# Patient Record
Sex: Female | Born: 1985 | Race: White | Hispanic: No | Marital: Married
Health system: Southern US, Community
[De-identification: ages and names within clinical notes are randomized; demographics above are authoritative.]

## PROBLEM LIST (undated history)

## (undated) HISTORY — PX: TUBAL LIGATION: SHX77

---
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
## 2007-02-07 ENCOUNTER — Emergency Department (HOSPITAL_COMMUNITY): Admission: EM | Admit: 2007-02-07 | Discharge: 2007-02-07 | Payer: Self-pay | Admitting: Emergency Medicine

## 2011-05-06 LAB — URINALYSIS, ROUTINE W REFLEX MICROSCOPIC
Hgb urine dipstick: NEGATIVE
Nitrite: NEGATIVE
Specific Gravity, Urine: 1.011
Urobilinogen, UA: 0.2
pH: 6.5

## 2011-05-06 LAB — POCT PREGNANCY, URINE
Operator id: 161631
Preg Test, Ur: NEGATIVE

## 2012-02-05 ENCOUNTER — Encounter (HOSPITAL_COMMUNITY): Payer: Self-pay | Admitting: *Deleted

## 2012-02-05 ENCOUNTER — Emergency Department (HOSPITAL_COMMUNITY)
Admission: EM | Admit: 2012-02-05 | Discharge: 2012-02-06 | Disposition: A | Discharge status: Home / Self Care | Payer: Medicaid Other | Attending: Emergency Medicine | Admitting: Emergency Medicine

## 2012-02-05 DIAGNOSIS — N200 Calculus of kidney: Secondary | ICD-10-CM

## 2012-02-05 DIAGNOSIS — N390 Urinary tract infection, site not specified: Secondary | ICD-10-CM

## 2012-02-05 LAB — CBC
MCHC: 32.8 g/dL (ref 30.0–36.0)
RDW: 15.7 % — ABNORMAL HIGH (ref 11.5–15.5)

## 2012-02-05 LAB — COMPREHENSIVE METABOLIC PANEL
ALT: 16 U/L (ref 0–35)
AST: 19 U/L (ref 0–37)
Albumin: 4.5 g/dL (ref 3.5–5.2)
Alkaline Phosphatase: 73 U/L (ref 39–117)
Potassium: 3.9 mEq/L (ref 3.5–5.1)
Sodium: 140 mEq/L (ref 135–145)
Total Protein: 7.9 g/dL (ref 6.0–8.3)

## 2012-02-05 MED ORDER — ONDANSETRON 4 MG PO TBDP
8.0000 mg | ORAL_TABLET | Freq: Once | ORAL | Status: AC
  Administered 2012-02-05: 8 mg via ORAL
  Filled 2012-02-05: qty 2

## 2012-02-05 NOTE — ED Notes (Signed)
Pt actively vomiting in waiting room. 

## 2012-02-05 NOTE — ED Notes (Signed)
Pt reports LLQ pain and lower back pain x 1 week. Was seen at [facility] for same. Having n/v/d, denies urinary or vaginal symptoms.

## 2012-02-05 NOTE — ED Notes (Signed)
Reports having coffee ground emesis today

## 2012-02-06 ENCOUNTER — Encounter (HOSPITAL_COMMUNITY): Payer: Self-pay | Admitting: Radiology

## 2012-02-06 ENCOUNTER — Emergency Department (HOSPITAL_COMMUNITY): Payer: Medicaid Other

## 2012-02-06 LAB — URINE MICROSCOPIC-ADD ON

## 2012-02-06 LAB — URINALYSIS, ROUTINE W REFLEX MICROSCOPIC
Glucose, UA: NEGATIVE mg/dL
Ketones, ur: 15 mg/dL — AB
pH: 6 (ref 5.0–8.0)

## 2012-02-06 LAB — PREGNANCY, URINE: Preg Test, Ur: NEGATIVE

## 2012-02-06 MED ORDER — ONDANSETRON HCL 4 MG PO TABS: 4.0000 mg | ORAL_TABLET | Freq: Four times a day (QID) | ORAL | Status: AC

## 2012-02-06 MED ORDER — ONDANSETRON HCL 4 MG/2ML IJ SOLN
4.0000 mg | Freq: Once | INTRAMUSCULAR | Status: AC
  Administered 2012-02-06: 4 mg via INTRAVENOUS
  Filled 2012-02-06: qty 2

## 2012-02-06 MED ORDER — CIPROFLOXACIN HCL 500 MG PO TABS: 500.0000 mg | ORAL_TABLET | Freq: Two times a day (BID) | ORAL | Status: AC

## 2012-02-06 MED ORDER — DEXTROSE 5 % IV SOLN
1.0000 g | Freq: Once | INTRAVENOUS | Status: AC
  Administered 2012-02-06: 1 g via INTRAVENOUS
  Filled 2012-02-06: qty 10

## 2012-02-06 MED ORDER — MORPHINE SULFATE 4 MG/ML IJ SOLN
4.0000 mg | Freq: Once | INTRAMUSCULAR | Status: AC
  Administered 2012-02-06: 4 mg via INTRAVENOUS
  Filled 2012-02-06: qty 1

## 2012-02-06 MED ORDER — HYDROCODONE-ACETAMINOPHEN 5-500 MG PO TABS: 1.0000 | ORAL_TABLET | Freq: Four times a day (QID) | ORAL | Status: AC | PRN

## 2012-02-06 NOTE — ED Provider Notes (Addendum)
History     CSN: [id number]  Arrival date & time 02/05/12  2952   First MD Initiated Contact with Patient 02/06/12 0026      Chief Complaint  Patient presents with  . Abdominal Pain    (Consider location/radiation/quality/duration/timing/severity/associated sxs/prior treatment) Patient is a 26 y.o. female presenting with abdominal pain. The history is provided by the patient.  Abdominal Pain The primary symptoms of the illness include abdominal pain, nausea and vomiting. The onset of the illness was gradual. The problem has been gradually worsening.  The abdominal pain began more than 2 days ago. The pain came on gradually. The abdominal pain has been unchanged since its onset. The abdominal pain is located in the LLQ. The abdominal pain does not radiate. The severity of the abdominal pain is 7/10. The abdominal pain is relieved by nothing. The abdominal pain is exacerbated by vomiting.  Nausea began today.  The vomiting began today. Vomiting occurs 2 to 5 times per day.  The patient states that she believes she is currently not pregnant. The patient has not had a change in bowel habit. Additional symptoms associated with the illness include back pain.    History reviewed. No pertinent past medical history.  Past Surgical History  Procedure Date  . Tubal ligation     History reviewed. No pertinent family history.  History  Substance Use Topics  . Smoking status: Not on file  . Smokeless tobacco: Not on file  . Alcohol Use: No    OB History    Grav Para Term Preterm Abortions TAB SAB Ect Mult Living                  Review of Systems  Gastrointestinal: Positive for nausea, vomiting and abdominal pain.  Musculoskeletal: Positive for back pain.  All other systems reviewed and are negative.    Allergies  Codeine  Home Medications  No current outpatient prescriptions on file.  BP 117/74  Pulse 71  Temp 98.2 F (36.8 C) (Oral)  Resp 20  SpO2 100%  LMP  01/23/2012  Physical Exam  Constitutional: She is oriented to person, place, and time. She appears well-developed and well-nourished.  HENT:  Head: Normocephalic and atraumatic.  Eyes: Conjunctivae and EOM are normal. Pupils are equal, round, and reactive to light.  Neck: Normal range of motion.  Cardiovascular: Normal rate, regular rhythm and normal heart sounds.   Pulmonary/Chest: Effort normal and breath sounds normal.  Abdominal: Soft. Bowel sounds are normal. There is tenderness.  Musculoskeletal: Normal range of motion.  Neurological: She is alert and oriented to person, place, and time.  Skin: Skin is warm and dry.  Psychiatric: She has a normal mood and affect. Her behavior is normal.    ED Course  Procedures (including critical care time)  Labs Reviewed  CBC - Abnormal; Notable for the following:    RDW 15.7 (*)     All other components within normal limits  COMPREHENSIVE METABOLIC PANEL - Abnormal; Notable for the following:    Creatinine, Ser 1.15 (*)     GFR calc non Af Amer 66 (*)     GFR calc Af Amer 76 (*)     All other components within normal limits  URINALYSIS, ROUTINE W REFLEX MICROSCOPIC - Abnormal; Notable for the following:    Color, Urine AMBER (*)  BIOCHEMICALS MAY BE AFFECTED BY COLOR   APPearance CLOUDY (*)     Specific Gravity, Urine 1.036 (*)     Bilirubin  Urine SMALL (*)     Ketones, ur 15 (*)     Protein, ur 30 (*)     Leukocytes, UA TRACE (*)     All other components within normal limits  URINE MICROSCOPIC-ADD ON - Abnormal; Notable for the following:    Squamous Epithelial / LPF MANY (*)     Bacteria, UA MANY (*)     Crystals CA OXALATE CRYSTALS (*)     All other components within normal limits  PREGNANCY, URINE   No results found.   No diagnosis found.    MDM  + llq tenderness,  Noted uti.  Await preg.  Will ct, analgeisa,  Abx,  Ivf,  reasses  + small stone,  Uti.  No sepsis.  No fever.  tol po.  Will dc with abx, and urology fu  with instructions to return for new or worsening sxs.  Pt and family vocalize understanding and agreement of instructions      Conan Mcmanaway Lytle Michaels, MD 02/06/12 0124  Lucindia Lemley Lytle Michaels, MD 02/06/12 4782

## 2015-11-13 ENCOUNTER — Emergency Department (HOSPITAL_COMMUNITY): Payer: Self-pay

## 2015-11-13 ENCOUNTER — Emergency Department (HOSPITAL_COMMUNITY)
Admission: EM | Admit: 2015-11-13 | Discharge: 2015-11-13 | Disposition: A | Discharge status: Home / Self Care | Payer: Self-pay | Attending: Emergency Medicine | Admitting: Emergency Medicine

## 2015-11-13 ENCOUNTER — Encounter (HOSPITAL_COMMUNITY): Payer: Self-pay | Admitting: Radiology

## 2015-11-13 DIAGNOSIS — S39012A Strain of muscle, fascia and tendon of lower back, initial encounter: Secondary | ICD-10-CM | POA: Insufficient documentation

## 2015-11-13 DIAGNOSIS — Y9389 Activity, other specified: Secondary | ICD-10-CM | POA: Insufficient documentation

## 2015-11-13 DIAGNOSIS — S42392A Other fracture of shaft of left humerus, initial encounter for closed fracture: Secondary | ICD-10-CM | POA: Insufficient documentation

## 2015-11-13 DIAGNOSIS — S42302A Unspecified fracture of shaft of humerus, left arm, initial encounter for closed fracture: Secondary | ICD-10-CM

## 2015-11-13 DIAGNOSIS — Z3202 Encounter for pregnancy test, result negative: Secondary | ICD-10-CM | POA: Insufficient documentation

## 2015-11-13 DIAGNOSIS — S7011XA Contusion of right thigh, initial encounter: Secondary | ICD-10-CM | POA: Insufficient documentation

## 2015-11-13 DIAGNOSIS — S301XXA Contusion of abdominal wall, initial encounter: Secondary | ICD-10-CM

## 2015-11-13 DIAGNOSIS — S7001XA Contusion of right hip, initial encounter: Secondary | ICD-10-CM | POA: Insufficient documentation

## 2015-11-13 DIAGNOSIS — S161XXA Strain of muscle, fascia and tendon at neck level, initial encounter: Secondary | ICD-10-CM | POA: Insufficient documentation

## 2015-11-13 DIAGNOSIS — Y92410 Unspecified street and highway as the place of occurrence of the external cause: Secondary | ICD-10-CM | POA: Insufficient documentation

## 2015-11-13 DIAGNOSIS — Z23 Encounter for immunization: Secondary | ICD-10-CM | POA: Insufficient documentation

## 2015-11-13 DIAGNOSIS — Y998 Other external cause status: Secondary | ICD-10-CM | POA: Insufficient documentation

## 2015-11-13 LAB — COMPREHENSIVE METABOLIC PANEL
ALBUMIN: 3.8 g/dL (ref 3.5–5.0)
ALT: 24 U/L (ref 14–54)
ANION GAP: 9 (ref 5–15)
AST: 42 U/L — AB (ref 15–41)
Alkaline Phosphatase: 58 U/L (ref 38–126)
BILIRUBIN TOTAL: 0.5 mg/dL (ref 0.3–1.2)
BUN: 10 mg/dL (ref 6–20)
CHLORIDE: 107 mmol/L (ref 101–111)
CO2: 22 mmol/L (ref 22–32)
Calcium: 9.2 mg/dL (ref 8.9–10.3)
Creatinine, Ser: 0.94 mg/dL (ref 0.44–1.00)
GFR calc Af Amer: >60 mL/min (ref >60)
GLUCOSE: 126 mg/dL — AB (ref 65–99)
POTASSIUM: 4.3 mmol/L (ref 3.5–5.1)
Sodium: 138 mmol/L (ref 135–145)
TOTAL PROTEIN: 7 g/dL (ref 6.5–8.1)

## 2015-11-13 LAB — CBC
HEMATOCRIT: 43.2 % (ref 36.0–46.0)
HEMOGLOBIN: 14 g/dL (ref 12.0–15.0)
MCH: 27.7 pg (ref 26.0–34.0)
MCHC: 32.4 g/dL (ref 30.0–36.0)
MCV: 85.5 fL (ref 78.0–100.0)
Platelets: 256 10*3/uL (ref 150–400)
RBC: 5.05 MIL/uL (ref 3.87–5.11)
RDW: 14.2 % (ref 11.5–15.5)
WBC: 14.2 10*3/uL — AB (ref 4.0–10.5)

## 2015-11-13 LAB — PROTIME-INR
INR: 1.01 (ref 0.00–1.49)
PROTHROMBIN TIME: 13.5 s (ref 11.6–15.2)

## 2015-11-13 LAB — I-STAT BETA HCG BLOOD, ED (MC, WL, AP ONLY): I-stat hCG, quantitative: <5 m[IU]/mL (ref <5)

## 2015-11-13 LAB — ETHANOL: Alcohol, Ethyl (B): <5 mg/dL (ref <5)

## 2015-11-13 MED ORDER — TETANUS-DIPHTH-ACELL PERTUSSIS 5-2.5-18.5 LF-MCG/0.5 IM SUSP
0.5000 mL | Freq: Once | INTRAMUSCULAR | Status: AC
  Administered 2015-11-13: 0.5 mL via INTRAMUSCULAR
  Filled 2015-11-13: qty 0.5

## 2015-11-13 MED ORDER — ONDANSETRON HCL 4 MG/2ML IJ SOLN
4.0000 mg | Freq: Once | INTRAMUSCULAR | Status: AC
  Administered 2015-11-13: 4 mg via INTRAVENOUS
  Filled 2015-11-13: qty 2

## 2015-11-13 MED ORDER — HYDROCODONE-ACETAMINOPHEN 5-325 MG PO TABS: 1.0000 | ORAL_TABLET | Freq: Four times a day (QID) | ORAL | Status: AC | PRN

## 2015-11-13 MED ORDER — HYDROMORPHONE HCL 1 MG/ML IJ SOLN
1.0000 mg | Freq: Once | INTRAMUSCULAR | Status: DC
  Filled 2015-11-13: qty 1

## 2015-11-13 MED ORDER — BACITRACIN ZINC 500 UNIT/GM EX OINT
1.0000 "application " | TOPICAL_OINTMENT | Freq: Two times a day (BID) | CUTANEOUS | Status: DC
  Filled 2015-11-13: qty 0.9

## 2015-11-13 MED ORDER — HYDROMORPHONE HCL 1 MG/ML IJ SOLN
1.0000 mg | Freq: Once | INTRAMUSCULAR | Status: AC
  Administered 2015-11-13: 1 mg via INTRAVENOUS
  Filled 2015-11-13: qty 1

## 2015-11-13 MED ORDER — HYDROMORPHONE HCL 1 MG/ML IJ SOLN
1.0000 mg | Freq: Once | INTRAMUSCULAR | Status: AC
Start: 2015-11-13 — End: 2015-11-13
  Administered 2015-11-13: 1 mg via INTRAVENOUS
  Filled 2015-11-13: qty 1

## 2015-11-13 MED ORDER — IOPAMIDOL (ISOVUE-300) INJECTION 61%
INTRAVENOUS | Status: AC
  Administered 2015-11-13: 75 mL
  Filled 2015-11-13: qty 75

## 2015-11-13 MED ORDER — ONDANSETRON HCL 4 MG PO TABS: 4.0000 mg | ORAL_TABLET | Freq: Four times a day (QID) | ORAL | Status: AC

## 2015-11-13 MED ORDER — IBUPROFEN 600 MG PO TABS: 600.0000 mg | ORAL_TABLET | Freq: Four times a day (QID) | ORAL | Status: AC | PRN

## 2015-11-13 MED ORDER — CYCLOBENZAPRINE HCL 10 MG PO TABS: 10.0000 mg | ORAL_TABLET | Freq: Two times a day (BID) | ORAL | Status: AC | PRN

## 2015-11-13 NOTE — Progress Notes (Signed)
Orthopedic Tech Progress Note Patient Details:  Samantha Shaffer 08/22/1985 [id number]  Ortho Devices Type of Ortho Device: Ace wrap, Coapt, Arm sling Ortho Device/Splint Location: LUE Ortho Device/Splint Interventions: Ordered, Application   Samantha Shaffer, Samantha Shaffer 11/13/2015, 3:46 PM
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
## 2015-11-13 NOTE — ED Notes (Signed)
Attempted to call pt. s husband, no answer at the home phone
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
## 2015-11-13 NOTE — ED Notes (Signed)
Samantha Shaffer stood pt. Up and ambulated her in the room.     Her gait is steady, but requires stand by assistance.  After pt. Was standing for a few minutes she became cool and clammy pale , We sat her back in the bed and propped her feet,  Placed a cool wash rag on  Her forehead. Pt. Stated, "I feel like I'm going to pass out."  She also had dry heaves.  Reported this to [employer] Browning. PA.
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
## 2017-11-14 DIAGNOSIS — R55 Syncope and collapse: Secondary | ICD-10-CM

## 2017-11-14 DIAGNOSIS — R42 Dizziness and giddiness: Secondary | ICD-10-CM

## 2017-11-15 DIAGNOSIS — R42 Dizziness and giddiness: Secondary | ICD-10-CM
